# Patient Record
Sex: Male | Born: 2000 | Race: Black or African American | Hispanic: No | Marital: Single
Health system: Southern US, Community
[De-identification: ages and names within clinical notes are randomized; demographics above are authoritative.]

## PROBLEM LIST (undated history)

## (undated) DIAGNOSIS — I421 Obstructive hypertrophic cardiomyopathy: Secondary | ICD-10-CM

## (undated) DIAGNOSIS — T7840XA Allergy, unspecified, initial encounter: Secondary | ICD-10-CM

## (undated) DIAGNOSIS — I469 Cardiac arrest, cause unspecified: Secondary | ICD-10-CM

## (undated) DIAGNOSIS — F909 Attention-deficit hyperactivity disorder, unspecified type: Secondary | ICD-10-CM

## (undated) DIAGNOSIS — Z8679 Personal history of other diseases of the circulatory system: Secondary | ICD-10-CM

## (undated) DIAGNOSIS — J40 Bronchitis, not specified as acute or chronic: Secondary | ICD-10-CM

## (undated) HISTORY — DX: Cardiac arrest, cause unspecified: I46.9

## (undated) HISTORY — DX: Personal history of other diseases of the circulatory system: Z86.79

---
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
## 2023-07-14 ENCOUNTER — Encounter: Payer: Self-pay | Admitting: Family Medicine

## 2023-07-14 ENCOUNTER — Ambulatory Visit (INDEPENDENT_AMBULATORY_CARE_PROVIDER_SITE_OTHER): Payer: MEDICAID | Admitting: Family Medicine

## 2023-07-14 VITALS — BP 112/70 | HR 66 | Temp 98.0°F | Ht 69.0 in | Wt 253.4 lb

## 2023-07-14 DIAGNOSIS — I4901 Ventricular fibrillation: Secondary | ICD-10-CM

## 2023-07-14 DIAGNOSIS — Z8674 Personal history of sudden cardiac arrest: Secondary | ICD-10-CM

## 2023-07-14 DIAGNOSIS — I422 Other hypertrophic cardiomyopathy: Secondary | ICD-10-CM | POA: Diagnosis not present

## 2023-07-14 DIAGNOSIS — Z7689 Persons encountering health services in other specified circumstances: Secondary | ICD-10-CM

## 2023-07-14 NOTE — Progress Notes (Signed)
New Patient Office Visit  Subjective    Patient ID: Ethan Griffin, male    DOB: 12/25/2000  Age: 22 y.o. MRN: [id number]  CC:  Chief Complaint  Patient presents with   Establish Care    Pt is here to Est.Care Pt reports he is under care of [facility] Pt reports his PCP was [facility] Pt reports he is not FASTING Pt has medical release form with him in office for new job.    HPI Ethan Griffin presents to establish care with new provider. Patient is accompanied by sister in law for support and to help with answering questions.   Previous primary care provider: [facility], Dr. Laurann Montana. Last seen: Unknown, thinks about 1-2 years.   Specialist: [facility]-Dr. Eber Hong    He has an appointment today with [facility] today. He has a history of hypertrophic cardiomyopathy, with history of cardiac arrest and ventricular fibrillation. Based on note, he was last seen by Larita Fife of [facility] on 03/13/2021. He is suppose to be taking Metoprolol 50mg  daily, but reports he does not take medication. Patients sister in law is requesting a [facility] cardiologist to be in network.    Outpatient Encounter Medications as of 07/14/2023  Medication Sig   metoprolol succinate (TOPROL-XL) 50 MG 24 hr tablet Take 1 tablet (50 mg total) by mouth daily.   [DISCONTINUED] HYDROcodone-acetaminophen (NORCO) 5-325 MG tablet Take 1 tablet by mouth every 6 (six) hours as needed for moderate pain. (Patient not taking: Reported on 07/14/2023)   [DISCONTINUED] methocarbamol (ROBAXIN) 500 MG tablet Take 1 tablet (500 mg total) by mouth every 6 (six) hours as needed for muscle spasms. (Patient not taking: Reported on 07/14/2023)   No facility-administered encounter medications on file as of 07/14/2023.    Past Medical History:  Diagnosis Date   ADHD (attention deficit hyperactivity  disorder)    no medication per mother ADHD   Allergy    Bronchitis    Cardiac arrest [facility])    H/O ventricular fibrillation    HOCM (hypertrophic obstructive cardiomyopathy) (HCC)    s/p arrest with subsequent ICD placement 2017    Past Surgical History:  Procedure Laterality Date   CARDIAC SURGERY  07/2016   Pacemaker Medtronic Single Chamber ICD   ORIF ELBOW FRACTURE Right 08/02/2020   Procedure: OPEN REDUCTION INTERNAL FIXATION (ORIF) ELBOW/OLECRANON FRACTURE;  Surgeon: Roby Lofts, MD;  Location: [facility] OR;  Service: Orthopedics;  Laterality: Right;    Family History  Problem Relation Age of Onset   Hypertension Mother    Arthritis Mother    Hypertension Maternal Grandmother    Arthritis Maternal Grandmother     Social History   Socioeconomic History   Marital status: Single    Spouse name: Not on file   Number of children: 0   Years of education: Not on file   Highest education level: High school graduate  Occupational History   Not on file  Tobacco Use   Smoking status: Passive Smoke Exposure - Never Smoker   Smokeless tobacco: Never  Vaping Use   Vaping status: Never Used  Substance and Sexual Activity   Alcohol use: Yes    Comment: 1x month 1-2 drinks   Drug use: Yes    Types: Marijuana    Comment: every other day   Sexual activity: Yes  Other Topics Concern   Not on file  Social History Narrative  Not on file   Social Determinants of Health   Financial Resource Strain: High Risk (07/14/2023)   Overall Financial Resource Strain (CARDIA)    Difficulty of Paying Living Expenses: Hard  Food Insecurity: No Food Insecurity (07/14/2023)   Hunger Vital Sign    Worried About Running Out of Food in the Last Year: Never true    Ran Out of Food in the Last Year: Never true  Transportation Needs: Unmet Transportation Needs (07/14/2023)   PRAPARE - Transportation    Lack of Transportation (Medical): No    Lack of Transportation (Non-Medical): Yes  Physical  Activity: Sufficiently Active (07/14/2023)   Exercise Vital Sign    Days of Exercise per Week: 2 days    Minutes of Exercise per Session: 90 min  Stress: No Stress Concern Present (07/14/2023)   [employer] of Occupational Health - Occupational Stress Questionnaire    Feeling of Stress : Only a little  Social Connections: Moderately Isolated (07/14/2023)   Social Connection and Isolation Panel [NHANES]    Frequency of Communication with Friends and Family: More than three times a week    Frequency of Social Gatherings with Friends and Family: Twice a week    Attends Religious Services: 1 to 4 times per year    Active Member of [employer] or Organizations: No    Attends Banker Meetings: Never    Marital Status: Never married  Catering manager Violence: Not on file    ROS See HPI above    Objective   BP 112/70   Pulse 66   Temp 98 F (36.7 C)   Ht 5\' 9"  (1.753 m)   Wt 253 lb 6 oz (114.9 kg)   SpO2 100%   BMI 37.42 kg/m   Physical Exam Vitals reviewed.  Constitutional:      General: He is not in acute distress.    Appearance: Normal appearance. He is obese. He is not ill-appearing, toxic-appearing or diaphoretic.  HENT:     Head: Normocephalic and atraumatic.  Eyes:     General:        Right eye: No discharge.        Left eye: No discharge.     Conjunctiva/sclera: Conjunctivae normal.  Cardiovascular:     Rate and Rhythm: Normal rate and regular rhythm.     Heart sounds: Normal heart sounds. No murmur heard.    No friction rub. No gallop.  Pulmonary:     Effort: Pulmonary effort is normal. No respiratory distress.     Breath sounds: Normal breath sounds.  Musculoskeletal:        General: Normal range of motion.  Skin:    General: Skin is warm and dry.  Neurological:     General: No focal deficit present.     Mental Status: He is alert and oriented to person, place, and time. Mental status is at baseline.  Psychiatric:        Mood and Affect: Mood  normal.        Behavior: Behavior normal.        Thought Content: Thought content normal.        Judgment: Judgment normal.      Assessment & Plan:  Other hypertrophic cardiomyopathy (HCC)  H/O cardiac arrest  Ventricular fibrillation (HCC)  Encounter to establish care  1.Review health maintenance: -Covid vaccine/booster: Declines vaccine/booster -Hep C screening: Will order labs at next visit  -Influenza vaccine: Will obtain later in season  -HIV screening: Will order  labs ar next visit.  2.Placed a referral to cardiology for a [facility] provider for cardiac history. Advised to please call the office in 2 weeks if he does not receive a phone call or a MyChart message in 2 weeks. Also, if he decides to go to his appointment with [facility] today, call the office and will cancel this cardiology referral. 3.Follow up in 2 weeks for a physical exam, but advised to please make sure he call a few days in advance to make sure we have pediatric records. Will not be able to sign off for employment without reviewing records from pediatrics and cardiology to sign off.  4.Please be fasting at physical appointment, nothing to eat, but only water or black coffee 6 hours before labs are drawn.   Return in about 2 weeks (around 07/28/2023) for physical.   Zandra Abts, NP

## 2023-07-14 NOTE — Patient Instructions (Signed)
-  It was a pleasure to meet you today and look forward to taking care of you. -Placed a referral to cardiology for a [facility] provider for cardiac history. Please call the office in 2 weeks if you do not receive a phone call or a MyChart message in 2 weeks. Also, if you decide to go to your appointment with [facility] today, call the office and will cancel this cardiology referral. -Follow up in 2 weeks for a physical exam, but please make sure you call a few days in advance to make sure we have pediatric records. Will not be able to sign off for employment with reviewing records from pediatrics and cardiology to sign off.  -Please be fasting at physical appointment, nothing to eat, but only water or black coffee 6 hours before labs are drawn.

## 2023-07-29 ENCOUNTER — Telehealth: Payer: Self-pay | Admitting: Family Medicine

## 2023-07-29 ENCOUNTER — Telehealth: Payer: Self-pay

## 2023-07-29 ENCOUNTER — Encounter: Payer: MEDICAID | Admitting: Family Medicine

## 2023-07-29 NOTE — Telephone Encounter (Signed)
Ethan Griffin has forms.

## 2023-07-29 NOTE — Telephone Encounter (Signed)
Left vm to call make appt.

## 2023-07-29 NOTE — Telephone Encounter (Signed)
Received forms from [facility] Printed & placed in provider bin

## 2023-07-29 NOTE — Telephone Encounter (Signed)
Left vm

## 2023-07-30 NOTE — Telephone Encounter (Signed)
Pt is checking with his ride and will call back to make Physical

## 2023-11-14 ENCOUNTER — Ambulatory Visit (HOSPITAL_COMMUNITY)
Admission: EM | Admit: 2023-11-14 | Discharge: 2023-11-14 | Disposition: A | Discharge status: Home / Self Care | Payer: MEDICAID | Attending: Emergency Medicine | Admitting: Emergency Medicine

## 2023-11-14 ENCOUNTER — Encounter (HOSPITAL_COMMUNITY): Payer: Self-pay | Admitting: Emergency Medicine

## 2023-11-14 DIAGNOSIS — J101 Influenza due to other identified influenza virus with other respiratory manifestations: Secondary | ICD-10-CM

## 2023-11-14 LAB — POC COVID19/FLU A&B COMBO
Covid Antigen, POC: NEGATIVE
Influenza A Antigen, POC: POSITIVE — AB
Influenza B Antigen, POC: NEGATIVE

## 2023-11-14 MED ORDER — OSELTAMIVIR PHOSPHATE 75 MG PO CAPS
75.0000 mg | ORAL_CAPSULE | Freq: Two times a day (BID) | ORAL | 0 refills | Status: AC
Start: 2023-11-14 — End: ?

## 2023-11-14 MED ORDER — ACETAMINOPHEN 325 MG PO TABS
ORAL_TABLET | ORAL | Status: AC
  Filled 2023-11-14: qty 2

## 2023-11-14 MED ORDER — ACETAMINOPHEN 325 MG PO TABS
650.0000 mg | ORAL_TABLET | Freq: Once | ORAL | Status: AC
  Administered 2023-11-14: 650 mg via ORAL

## 2023-11-14 MED ORDER — IBUPROFEN 600 MG PO TABS: 600.0000 mg | ORAL_TABLET | Freq: Three times a day (TID) | ORAL | 0 refills | Status: AC | PRN

## 2023-11-14 NOTE — Discharge Instructions (Addendum)
You have the flu.  I have ordered Tamiflu. Drink extra fluids, rest.  No work until fever free x 24 hours

## 2023-11-14 NOTE — ED Provider Notes (Signed)
[facility]    CSN: [id number] Arrival date & time: 11/14/23  1720      History   Chief Complaint Chief Complaint  Patient presents with   Cough    HPI Ethan Griffin is a 23 y.o. male.   Patient here today with c/o cough, runny nose, fatigue, chills, sweats, and body aches X 1 day. He has been taking Dayquil with no relief. No known sick contacts.  Temp is 103.0 at this time and was elevated at home.      The history is provided by the patient.  Cough Associated symptoms: chills, diaphoresis, fever and rhinorrhea     Past Medical History:  Diagnosis Date   ADHD (attention deficit hyperactivity disorder)    no medication per mother ADHD   Allergy    Bronchitis    Cardiac arrest [facility])    H/O ventricular fibrillation    HOCM (hypertrophic obstructive cardiomyopathy) (HCC)    s/p arrest with subsequent ICD placement 2017    Patient Active Problem List   Diagnosis Date Noted   Ventricular fibrillation (HCC) 07/20/2018   Syncope 07/20/2018   H/O cardiac arrest 10/13/2016   ICD (implantable cardioverter-defibrillator), single, 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 Authorizing Provider  ibuprofen (ADVIL) 600 MG tablet Take 1 tablet (600 mg total) by mouth every 8 (eight) hours as needed for fever. 11/14/23  Yes Koreena Joost, Linde Gillis, NP  oseltamivir (TAMIFLU) 75 MG capsule Take 1 capsule (75 mg total) by mouth every 12 (twelve) hours. 11/14/23  Yes Lindsi Bayliss, Linde Gillis, NP  metoprolol succinate (TOPROL-XL) 50 MG 24 hr  tablet Take 1 tablet (50 mg total) by mouth daily. 01/18/21 02/17/21  Long, Arlyss Repress, MD    Family History Family History  Problem Relation Age of Onset   Hypertension Mother    Arthritis Mother    Hypertension Maternal Grandmother    Arthritis Maternal Grandmother     Social History Social History   Tobacco Use   Smoking status: Never    Passive exposure: Yes   Smokeless tobacco: Never  Vaping Use   Vaping status: Never Used  Substance Use Topics   Alcohol use: Yes    Comment: socially   Drug use: Yes    Types: Marijuana    Comment: every other day     Allergies   Patient has no known allergies.   Review of Systems Review of Systems  Constitutional:  Positive for chills, diaphoresis, fatigue and fever.  HENT:  Positive for congestion, postnasal drip and rhinorrhea.   Respiratory:  Positive for cough.   Cardiovascular: Negative.   Gastrointestinal:  Positive for vomiting.  All other systems reviewed and are negative.    Physical Exam Triage Vital Signs ED Triage Vitals  Encounter Vitals Group     BP 11/14/23 1818 132/78     Systolic BP Percentile --      Diastolic BP Percentile --      Pulse Rate 11/14/23 1818 (!) 109     Resp 11/14/23 1818  16     Temp 11/14/23 1818 (!) 103 F (39.4 C)     Temp Source 11/14/23 1818 Oral     SpO2 11/14/23 1818 95 %     Weight 11/14/23 1818 255 lb (115.7 kg)     Height 11/14/23 1818 5\' 10"  (1.778 m)     Head Circumference --      Peak Flow --      Pain Score 11/14/23 1816 7     Pain Loc --      Pain Education --      Exclude from Growth Chart --    No data found.  Updated Vital Signs BP 132/78 (BP Location: Left Arm)   Pulse (!) 109   Temp (!) 103 F (39.4 C) (Oral)   Resp 16   Ht 5\' 10"  (1.778 m)   Wt 255 lb (115.7 kg)   SpO2 95%   BMI 36.59 kg/m   Visual Acuity Right Eye Distance:   Left Eye Distance:   Bilateral Distance:    Right Eye Near:   Left Eye Near:    Bilateral Near:     Physical  Exam Constitutional:      Appearance: He is ill-appearing.  HENT:     Right Ear: Tympanic membrane is erythematous.     Left Ear: Tympanic membrane is erythematous.  Cardiovascular:     Rate and Rhythm: Regular rhythm. Tachycardia present.     Heart sounds: Normal heart sounds.  Pulmonary:     Effort: Pulmonary effort is normal.     Breath sounds: Normal breath sounds.  Abdominal:     General: Abdomen is flat. Bowel sounds are normal.     Palpations: Abdomen is soft.  Neurological:     Mental Status: He is oriented to person, place, and time.      UC Treatments / Results  Labs (all labs ordered are listed, but only abnormal results are displayed) Labs Reviewed  POC COVID19/FLU A&B COMBO - Abnormal; Notable for the following components:      Result Value   Influenza A Antigen, POC Positive (*)    All other components within normal limits    EKG   Radiology No results found.  Procedures Procedures (including critical care time)  Medications Ordered in UC Medications  acetaminophen (TYLENOL) tablet 650 mg (650 mg Oral Given 11/14/23 1824)    Initial Impression / Assessment and Plan / UC Course  I have reviewed the triage vital signs and the nursing notes.  Pertinent labs & imaging results that were available during my care of the patient were reviewed by me and considered in my medical decision making (see chart for details).   Patient is positive for Flu.  He is high risk d/t cardiac history.  We have prescribed tamiflu.  Motrin recommended for fever.   Recommend f/u with PCP    Final Clinical Impressions(s) / UC Diagnoses   Final diagnoses:  Influenza A     Discharge Instructions      You have the flu.  I have ordered Tamiflu. Drink extra fluids, rest.  No work until fever free x 24 hours     ED Prescriptions     Medication Sig Dispense Auth. Provider   oseltamivir (TAMIFLU) 75 MG capsule Take 1 capsule (75 mg total) by mouth every 12 (twelve)  hours. 10 capsule Zareah Hunzeker, Linde Gillis, NP   ibuprofen (ADVIL) 600 MG tablet Take 1 tablet (600 mg total) by mouth every 8 (eight) hours  as needed for fever. 30 tablet Chrstopher Malenfant, Linde Gillis, NP      PDMP not reviewed this encounter.   Nelda Marseille, NP 11/14/23 [phone]

## 2023-11-14 NOTE — ED Triage Notes (Signed)
Patient here today with c/o cough, runny nose, fatigue, chills, sweats, and body aches X 1 day. He has been taking Dayquil with no relief. No known sick contacts.

## 2024-08-05 ENCOUNTER — Other Ambulatory Visit: Payer: Self-pay

## 2024-08-05 ENCOUNTER — Encounter (HOSPITAL_BASED_OUTPATIENT_CLINIC_OR_DEPARTMENT_OTHER): Payer: Self-pay | Admitting: Emergency Medicine

## 2024-08-05 ENCOUNTER — Emergency Department (HOSPITAL_BASED_OUTPATIENT_CLINIC_OR_DEPARTMENT_OTHER)
Admission: EM | Admit: 2024-08-05 | Discharge: 2024-08-05 | Discharge status: Against Medical Advice | Payer: MEDICAID | Attending: Emergency Medicine | Admitting: Emergency Medicine

## 2024-08-05 ENCOUNTER — Emergency Department (HOSPITAL_BASED_OUTPATIENT_CLINIC_OR_DEPARTMENT_OTHER): Payer: MEDICAID

## 2024-08-05 DIAGNOSIS — R519 Headache, unspecified: Secondary | ICD-10-CM | POA: Insufficient documentation

## 2024-08-05 LAB — BASIC METABOLIC PANEL WITH GFR
Anion gap: 11 (ref 5–15)
BUN: 9 mg/dL (ref 6–20)
CO2: 28 mmol/L (ref 22–32)
Calcium: 9.6 mg/dL (ref 8.9–10.3)
Chloride: 104 mmol/L (ref 98–111)
Creatinine, Ser: 0.96 mg/dL (ref 0.61–1.24)
GFR, Estimated: >60 mL/min (ref >60)
Glucose, Bld: 92 mg/dL (ref 70–99)
Potassium: 4 mmol/L (ref 3.5–5.1)
Sodium: 143 mmol/L (ref 135–145)

## 2024-08-05 LAB — CBC WITH DIFFERENTIAL/PLATELET
Abs Immature Granulocytes: 0.02 K/uL (ref 0.00–0.07)
Basophils Absolute: 0.1 K/uL (ref 0.0–0.1)
Basophils Relative: 1 %
Eosinophils Absolute: 0.2 K/uL (ref 0.0–0.5)
Eosinophils Relative: 2 %
HCT: 40.2 % (ref 39.0–52.0)
Hemoglobin: 12.7 g/dL — ABNORMAL LOW (ref 13.0–17.0)
Immature Granulocytes: 0 %
Lymphocytes Relative: 23 %
Lymphs Abs: 1.9 K/uL (ref 0.7–4.0)
MCH: 23.6 pg — ABNORMAL LOW (ref 26.0–34.0)
MCHC: 31.6 g/dL (ref 30.0–36.0)
MCV: 74.6 fL — ABNORMAL LOW (ref 80.0–100.0)
Monocytes Absolute: 0.6 K/uL (ref 0.1–1.0)
Monocytes Relative: 7 %
Neutro Abs: 5.8 K/uL (ref 1.7–7.7)
Neutrophils Relative %: 67 %
Platelets: 323 K/uL (ref 150–400)
RBC: 5.39 MIL/uL (ref 4.22–5.81)
RDW: 16.3 % — ABNORMAL HIGH (ref 11.5–15.5)
WBC: 8.6 K/uL (ref 4.0–10.5)
nRBC: 0 % (ref 0.0–0.2)

## 2024-08-05 LAB — URINE DRUG SCREEN
Amphetamines: NEGATIVE
Barbiturates: NEGATIVE
Benzodiazepines: NEGATIVE
Cocaine: NEGATIVE
Fentanyl: NEGATIVE
Methadone Scn, Ur: NEGATIVE
Opiates: NEGATIVE
Tetrahydrocannabinol: POSITIVE — AB

## 2024-08-05 LAB — TROPONIN T, HIGH SENSITIVITY: Troponin T High Sensitivity: <15 ng/L (ref 0–19)

## 2024-08-05 MED ORDER — DIPHENHYDRAMINE HCL 50 MG/ML IJ SOLN
12.5000 mg | Freq: Once | INTRAMUSCULAR | Status: DC
  Filled 2024-08-05: qty 1

## 2024-08-05 MED ORDER — LACTATED RINGERS IV BOLUS
1000.0000 mL | Freq: Once | INTRAVENOUS | Status: AC
  Administered 2024-08-05: 1000 mL via INTRAVENOUS

## 2024-08-05 MED ORDER — PROCHLORPERAZINE EDISYLATE 10 MG/2ML IJ SOLN
10.0000 mg | Freq: Once | INTRAMUSCULAR | Status: AC
  Administered 2024-08-05: 10 mg via INTRAVENOUS
  Filled 2024-08-05: qty 2

## 2024-08-05 MED ORDER — ACETAMINOPHEN 500 MG PO TABS
1000.0000 mg | ORAL_TABLET | Freq: Once | ORAL | Status: AC
  Administered 2024-08-05: 1000 mg via ORAL
  Filled 2024-08-05: qty 2

## 2024-08-05 NOTE — ED Notes (Signed)
 CT went to room to get Pt for his CT and Pt was not in his room. Went to check bathrooms and all are empty. IV was pulled and left in the trash can. Pt's phone and belongings are not in room.  Pt's gown was left on his bed. Provider was made aware.

## 2024-08-05 NOTE — ED Notes (Addendum)
 Pt's sisters were explaining to me that Pt was brought to ED because Pt had a sudden onset of H/A, confusion, feeling week, disoriented, and drooling from the side of his mouth. Pt was trying to fall asleep in the back seats but his sisters would not let him because they were worried about him. Arrival to ED symptoms returned to normal except Pt reports a severe H/A still. Pt was medicated and had quick resolve of his H/A.
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
## 2024-08-05 NOTE — ED Triage Notes (Signed)
 Reports headache, R sided arm weakness x 1 hour. No deficits upon exam. Ambulatory without issue. NIH 0.

## 2024-08-05 NOTE — ED Notes (Signed)
 Pt came back from using restroom to give us  a urine sample and was very diaphoretic and cool to touch. Pt stated he feels like he his burning up and doesn't feel good at all. Pt unable to stay still in the bed. Pt unable to explain exactly how he is feeling just that  I just don't feel right at all. Provider was made aware and came into room to re-evaluate Pt.
